# Patient Record
Sex: Female | Born: 1950 | Race: White | Hispanic: No | Marital: Married | Smoking: Former smoker
Health system: Southern US, Community
[De-identification: ages and names within clinical notes are randomized; demographics above are authoritative.]

## PROBLEM LIST (undated history)

## (undated) DIAGNOSIS — J302 Other seasonal allergic rhinitis: Secondary | ICD-10-CM

## (undated) DIAGNOSIS — M810 Age-related osteoporosis without current pathological fracture: Secondary | ICD-10-CM

## (undated) HISTORY — PX: BREAST SURGERY: SHX581

---

## 2004-07-20 ENCOUNTER — Encounter: Payer: Self-pay | Admitting: General Practice

## 2004-08-15 ENCOUNTER — Encounter: Payer: Self-pay | Admitting: General Practice

## 2004-10-13 ENCOUNTER — Ambulatory Visit: Payer: Self-pay | Admitting: Family Medicine

## 2004-10-24 ENCOUNTER — Ambulatory Visit: Payer: Self-pay | Admitting: Internal Medicine

## 2004-12-11 ENCOUNTER — Ambulatory Visit: Payer: Self-pay | Admitting: Surgery

## 2004-12-15 ENCOUNTER — Ambulatory Visit: Payer: Self-pay | Admitting: Surgery

## 2005-01-05 ENCOUNTER — Ambulatory Visit: Payer: Self-pay | Admitting: Surgery

## 2005-08-31 ENCOUNTER — Ambulatory Visit: Payer: Self-pay | Admitting: General Practice

## 2006-11-06 ENCOUNTER — Ambulatory Visit: Payer: Self-pay

## 2010-04-14 ENCOUNTER — Emergency Department: Payer: Self-pay | Admitting: Emergency Medicine

## 2010-06-13 ENCOUNTER — Ambulatory Visit: Payer: Self-pay

## 2011-01-25 ENCOUNTER — Ambulatory Visit: Payer: Self-pay

## 2011-07-26 ENCOUNTER — Ambulatory Visit: Payer: Self-pay

## 2011-09-12 ENCOUNTER — Ambulatory Visit: Payer: Self-pay | Admitting: Internal Medicine

## 2012-04-23 ENCOUNTER — Ambulatory Visit: Payer: Self-pay | Admitting: Internal Medicine

## 2012-10-01 ENCOUNTER — Ambulatory Visit: Payer: Self-pay
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
## 2013-08-28 IMAGING — MG MM CAD SCREENING MAMMO
1 series · 6 of 6 positions shown · non-contrast
Comparison: none

REASON FOR EXAM: scr mammo no order
COMMENTS:

PROCEDURE:     MAM - MAM DGTL SCRN MAM NO ORDER W/CAD  - October 01, 2012  [DATE]
RESULT:     COMPARISON:  04/23/2012, 07/26/2011, 06/13/2010, 12/11/2004
TECHNIQUE: Digital screening mammograms were obtained. FDA approved
computer-aided detection (CAD) for mammography was utilized for this study.

[R CC · right · 6 of 6 slices shown]
[im 1/6]
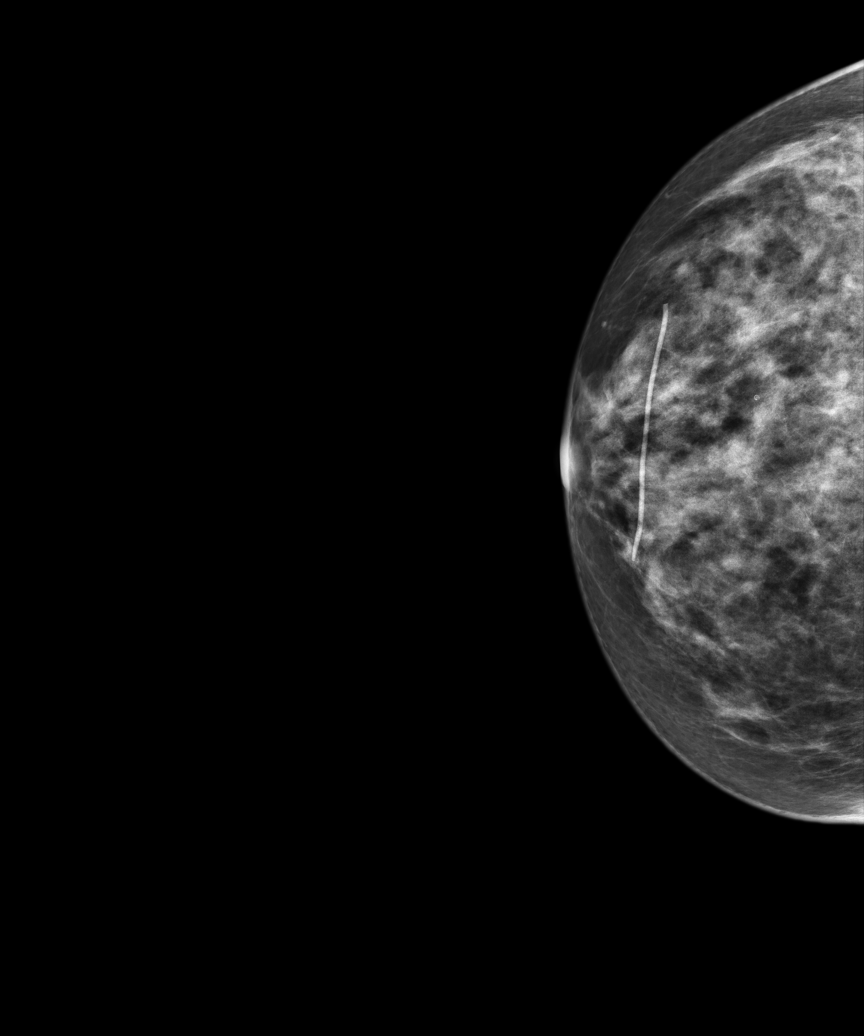
[im 2/6]
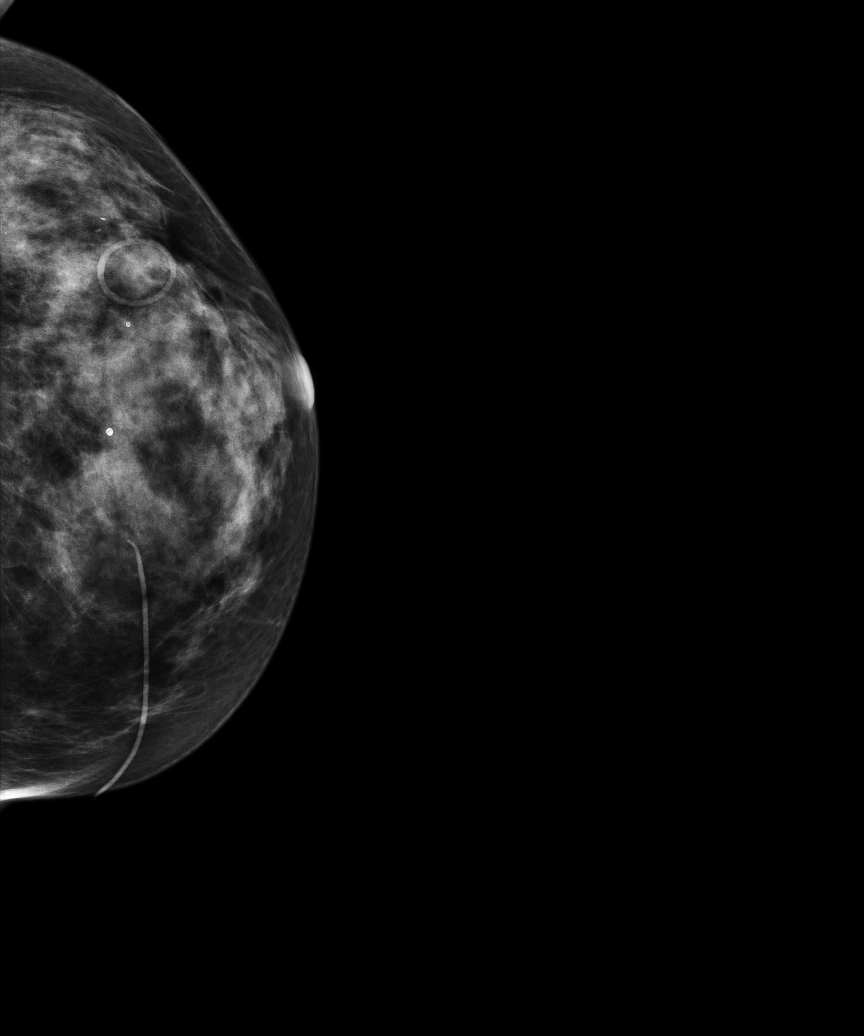
[im 3/6]
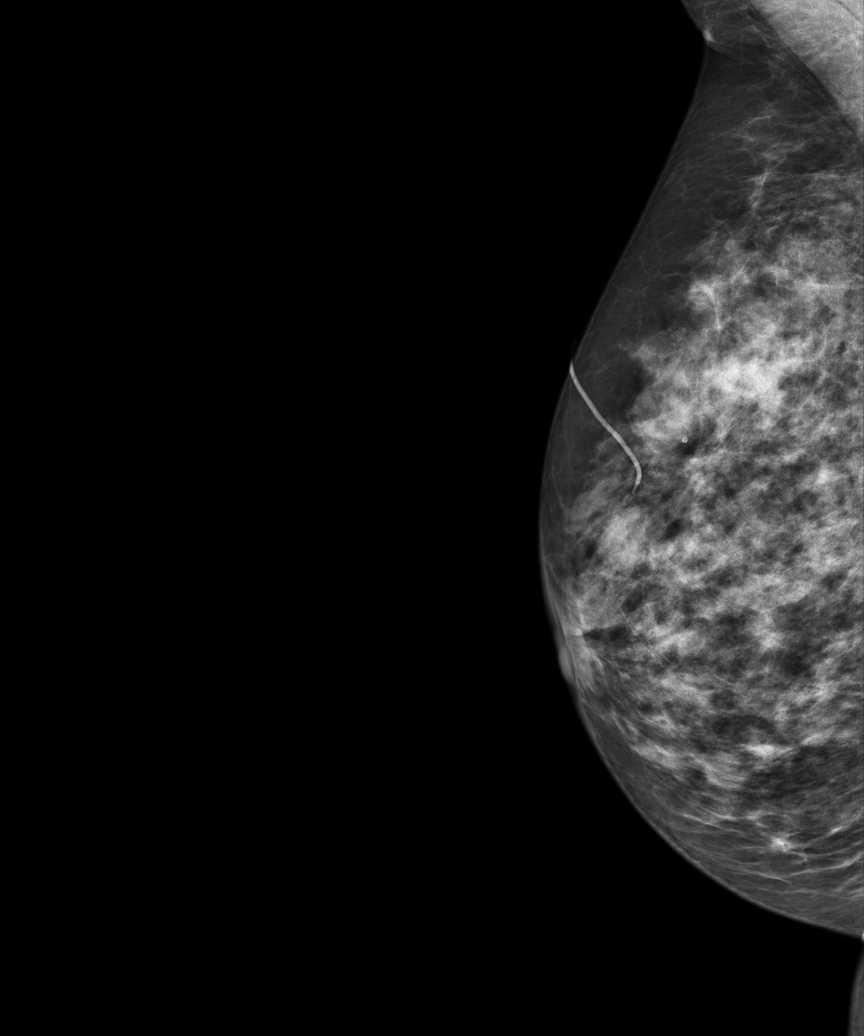
[im 4/6]
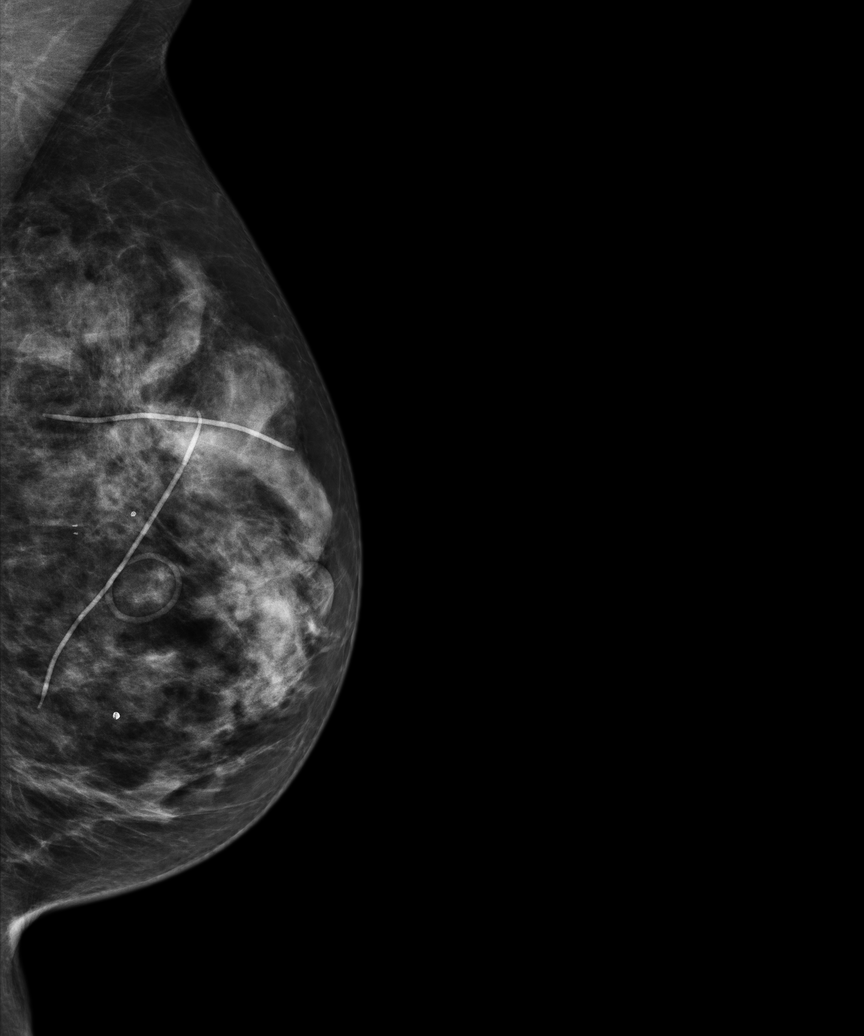
[im 5/6]
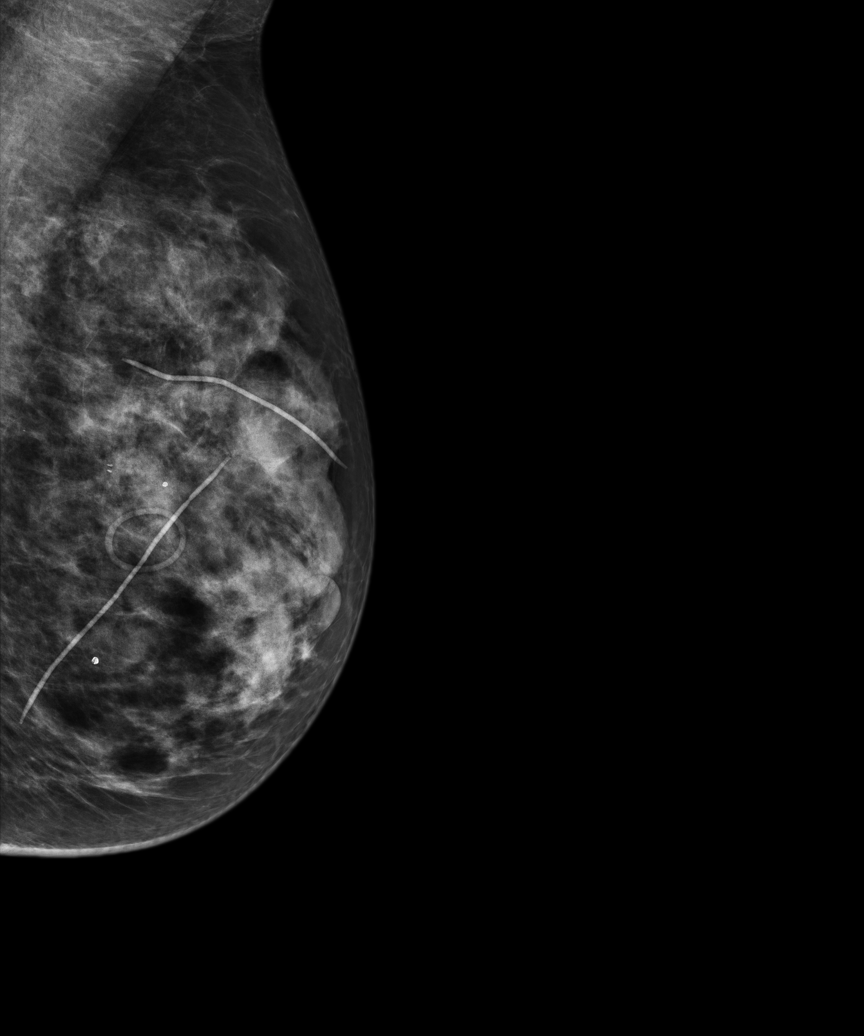
[im 6/6]
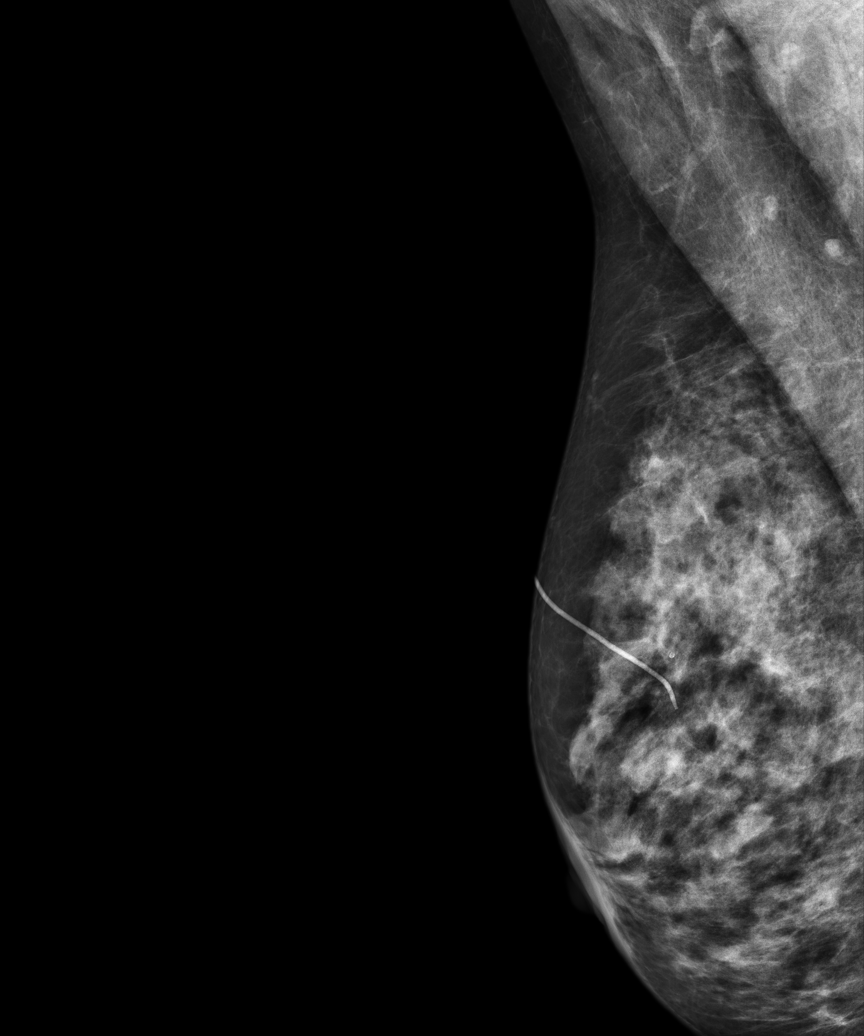

[6 of 6 positions shown; findings below may reference images not displayed]

FINDING: Bilateral breasts are heterogeneously dense which may lower the sensitivity
of mammography.  There is no dominant mass, architectural distortion or
clusters of suspicious microcalcifications.
IMPRESSION: 1.     Stable bilateral mammogram.
2.     Annual mammographic follow up recommended.
3.     BI-RADS:  Category 2- Benign.

A negative mammogram report does not preclude biopsy or other evaluation of
a clinically palpable or otherwise suspicious mass or lesion. Breast cancer
may not be detected by mammography in up to 10% of cases.

[REDACTED]

## 2013-10-05 ENCOUNTER — Ambulatory Visit: Payer: Self-pay

## 2014-10-11 ENCOUNTER — Ambulatory Visit: Payer: Self-pay

## 2015-02-20 ENCOUNTER — Emergency Department
Admission: EM | Admit: 2015-02-20 | Discharge: 2015-02-20 | Disposition: A | Discharge status: Home / Self Care | Payer: 59 | Attending: Emergency Medicine | Admitting: Emergency Medicine

## 2015-02-20 ENCOUNTER — Encounter: Payer: Self-pay | Admitting: *Deleted

## 2015-02-20 DIAGNOSIS — Z87891 Personal history of nicotine dependence: Secondary | ICD-10-CM | POA: Diagnosis not present

## 2015-02-20 DIAGNOSIS — Y998 Other external cause status: Secondary | ICD-10-CM | POA: Diagnosis not present

## 2015-02-20 DIAGNOSIS — T310 Burns involving less than 10% of body surface: Secondary | ICD-10-CM

## 2015-02-20 DIAGNOSIS — T2112XA Burn of first degree of abdominal wall, initial encounter: Secondary | ICD-10-CM | POA: Diagnosis present

## 2015-02-20 DIAGNOSIS — X12XXXA Contact with other hot fluids, initial encounter: Secondary | ICD-10-CM | POA: Diagnosis not present

## 2015-02-20 DIAGNOSIS — Y9389 Activity, other specified: Secondary | ICD-10-CM | POA: Diagnosis not present

## 2015-02-20 DIAGNOSIS — Z23 Encounter for immunization: Secondary | ICD-10-CM | POA: Insufficient documentation

## 2015-02-20 DIAGNOSIS — Z7952 Long term (current) use of systemic steroids: Secondary | ICD-10-CM | POA: Diagnosis not present

## 2015-02-20 DIAGNOSIS — Y9289 Other specified places as the place of occurrence of the external cause: Secondary | ICD-10-CM | POA: Insufficient documentation

## 2015-02-20 HISTORY — DX: Other seasonal allergic rhinitis: J30.2

## 2015-02-20 HISTORY — DX: Age-related osteoporosis without current pathological fracture: M81.0

## 2015-02-20 MED ORDER — TETANUS-DIPHTH-ACELL PERTUSSIS 5-2.5-18.5 LF-MCG/0.5 IM SUSP
0.5000 mL | Freq: Once | INTRAMUSCULAR | Status: AC
  Administered 2015-02-20: 0.5 mL via INTRAMUSCULAR

## 2015-02-20 MED ORDER — SILVER SULFADIAZINE 1 % EX CREA
TOPICAL_CREAM | CUTANEOUS | Status: AC
  Administered 2015-02-20: 1 via TOPICAL
  Filled 2015-02-20: qty 85

## 2015-02-20 MED ORDER — TETANUS-DIPHTH-ACELL PERTUSSIS 5-2.5-18.5 LF-MCG/0.5 IM SUSP
INTRAMUSCULAR | Status: AC
  Administered 2015-02-20: 0.5 mL via INTRAMUSCULAR
  Filled 2015-02-20: qty 0.5

## 2015-02-20 MED ORDER — SILVER SULFADIAZINE 1 % EX CREA
TOPICAL_CREAM | Freq: Once | CUTANEOUS | Status: AC
  Administered 2015-02-20: 1 via TOPICAL

## 2015-02-20 MED ORDER — HYDROCODONE-ACETAMINOPHEN 5-325 MG PO TABS
1.0000 | ORAL_TABLET | Freq: Once | ORAL | Status: AC
  Administered 2015-02-20: 1 via ORAL

## 2015-02-20 MED ORDER — HYDROCODONE-ACETAMINOPHEN 5-325 MG PO TABS
ORAL_TABLET | ORAL | Status: AC
  Administered 2015-02-20: 1 via ORAL
  Filled 2015-02-20: qty 1

## 2015-02-20 MED ORDER — HYDROCODONE-ACETAMINOPHEN 5-325 MG PO TABS: 1.0000 | ORAL_TABLET | ORAL | Status: AC | PRN
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
## 2016-02-22 ENCOUNTER — Emergency Department: Payer: No Typology Code available for payment source

## 2016-02-22 ENCOUNTER — Encounter: Payer: Self-pay | Admitting: Emergency Medicine

## 2016-02-22 ENCOUNTER — Emergency Department
Admission: EM | Admit: 2016-02-22 | Discharge: 2016-02-22 | Disposition: A | Discharge status: Home / Self Care | Payer: No Typology Code available for payment source | Attending: Student | Admitting: Student

## 2016-02-22 DIAGNOSIS — Y92488 Other paved roadways as the place of occurrence of the external cause: Secondary | ICD-10-CM | POA: Insufficient documentation

## 2016-02-22 DIAGNOSIS — Y9389 Activity, other specified: Secondary | ICD-10-CM | POA: Insufficient documentation

## 2016-02-22 DIAGNOSIS — S92321A Displaced fracture of second metatarsal bone, right foot, initial encounter for closed fracture: Secondary | ICD-10-CM | POA: Insufficient documentation

## 2016-02-22 DIAGNOSIS — S92311A Displaced fracture of first metatarsal bone, right foot, initial encounter for closed fracture: Secondary | ICD-10-CM | POA: Insufficient documentation

## 2016-02-22 DIAGNOSIS — S20212A Contusion of left front wall of thorax, initial encounter: Secondary | ICD-10-CM | POA: Diagnosis not present

## 2016-02-22 DIAGNOSIS — Y999 Unspecified external cause status: Secondary | ICD-10-CM | POA: Insufficient documentation

## 2016-02-22 DIAGNOSIS — S92301A Fracture of unspecified metatarsal bone(s), right foot, initial encounter for closed fracture: Secondary | ICD-10-CM

## 2016-02-22 DIAGNOSIS — Z87891 Personal history of nicotine dependence: Secondary | ICD-10-CM | POA: Insufficient documentation

## 2016-02-22 DIAGNOSIS — M79671 Pain in right foot: Secondary | ICD-10-CM | POA: Diagnosis present

## 2016-02-22 LAB — COMPREHENSIVE METABOLIC PANEL
ALBUMIN: 3.9 g/dL (ref 3.5–5.0)
ALT: 18 U/L (ref 14–54)
ANION GAP: 5 (ref 5–15)
AST: 27 U/L (ref 15–41)
Alkaline Phosphatase: 54 U/L (ref 38–126)
BUN: 14 mg/dL (ref 6–20)
CHLORIDE: 100 mmol/L — AB (ref 101–111)
CO2: 28 mmol/L (ref 22–32)
CREATININE: 0.91 mg/dL (ref 0.44–1.00)
Calcium: 9.3 mg/dL (ref 8.9–10.3)
GFR calc non Af Amer: >60 mL/min (ref >60)
GLUCOSE: 100 mg/dL — AB (ref 65–99)
POTASSIUM: 3.8 mmol/L (ref 3.5–5.1)
SODIUM: 133 mmol/L — AB (ref 135–145)
TOTAL PROTEIN: 6.4 g/dL — AB (ref 6.5–8.1)
Total Bilirubin: 0.6 mg/dL (ref 0.3–1.2)

## 2016-02-22 LAB — TROPONIN I: Troponin I: <0.03 ng/mL (ref <0.031)

## 2016-02-22 LAB — CBC WITH DIFFERENTIAL/PLATELET
BASOS PCT: 1 %
Basophils Absolute: 0 10*3/uL (ref 0–0.1)
EOS ABS: 0.1 10*3/uL (ref 0–0.7)
Eosinophils Relative: 1 %
HCT: 38.2 % (ref 35.0–47.0)
HEMOGLOBIN: 12.8 g/dL (ref 12.0–16.0)
Lymphocytes Relative: 15 %
Lymphs Abs: 0.8 10*3/uL — ABNORMAL LOW (ref 1.0–3.6)
MCH: 30.5 pg (ref 26.0–34.0)
MCHC: 33.5 g/dL (ref 32.0–36.0)
MCV: 91 fL (ref 80.0–100.0)
Monocytes Absolute: 0.2 10*3/uL (ref 0.2–0.9)
Monocytes Relative: 4 %
NEUTROS PCT: 79 %
Neutro Abs: 4 10*3/uL (ref 1.4–6.5)
Platelets: 252 10*3/uL (ref 150–440)
RBC: 4.19 MIL/uL (ref 3.80–5.20)
RDW: 13.1 % (ref 11.5–14.5)
WBC: 5.1 10*3/uL (ref 3.6–11.0)

## 2016-02-22 MED ORDER — OXYCODONE HCL 5 MG PO TABS: 5.0000 mg | ORAL_TABLET | Freq: Four times a day (QID) | ORAL | Status: AC | PRN

## 2016-02-22 MED ORDER — ONDANSETRON HCL 4 MG/2ML IJ SOLN
INTRAMUSCULAR | Status: AC
  Administered 2016-02-22: 4 mg via INTRAVENOUS
  Filled 2016-02-22: qty 2

## 2016-02-22 MED ORDER — MORPHINE SULFATE (PF) 4 MG/ML IV SOLN
4.0000 mg | Freq: Once | INTRAVENOUS | Status: AC
  Administered 2016-02-22: 4 mg via INTRAVENOUS

## 2016-02-22 MED ORDER — ONDANSETRON HCL 4 MG/2ML IJ SOLN
4.0000 mg | Freq: Once | INTRAMUSCULAR | Status: AC
  Administered 2016-02-22: 4 mg via INTRAVENOUS

## 2016-02-22 MED ORDER — MORPHINE SULFATE (PF) 4 MG/ML IV SOLN
INTRAVENOUS | Status: AC
  Administered 2016-02-22: 4 mg via INTRAVENOUS
  Filled 2016-02-22: qty 1

## 2016-02-22 MED ORDER — SODIUM CHLORIDE 0.9 % IV BOLUS (SEPSIS)
1000.0000 mL | Freq: Once | INTRAVENOUS | Status: AC
  Administered 2016-02-22: 1000 mL via INTRAVENOUS
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
## 2016-02-22 NOTE — ED Notes (Signed)
MD at bedside.  Dr. Inocencio HomesGayle in room to assess patient.  Will continue to monitor.
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
## 2018-02-12 DEATH — deceased
